# Patient Record
Sex: Male | Born: 2005 | Hispanic: No | Marital: Single | Smoking: Never smoker
Health system: Southern US, Community
[De-identification: ages and names within clinical notes are randomized; demographics above are authoritative.]

## PROBLEM LIST (undated history)

## (undated) ENCOUNTER — Ambulatory Visit (HOSPITAL_COMMUNITY): Source: Home / Self Care

## (undated) DIAGNOSIS — J309 Allergic rhinitis, unspecified: Principal | ICD-10-CM

## (undated) DIAGNOSIS — J302 Other seasonal allergic rhinitis: Secondary | ICD-10-CM

## (undated) HISTORY — PX: HERNIA REPAIR: SHX51

## (undated) HISTORY — DX: Allergic rhinitis, unspecified: J30.9

---
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
## 2023-01-09 DIAGNOSIS — Z202 Contact with and (suspected) exposure to infections with a predominantly sexual mode of transmission: Secondary | ICD-10-CM | POA: Diagnosis not present

## 2023-07-06 IMAGING — US US SCROTUM W/ DOPPLER COMPLETE
1 series · 13 of 25 positions shown · non-contrast
Comparison: None

CLINICAL DATA: Acute onset of RIGHT testicular pain this morning

EXAM:
SCROTAL ULTRASOUND
DOPPLER ULTRASOUND OF THE TESTICLES
TECHNIQUE: Complete ultrasound examination of the testicles, epididymis, and
other scrotal structures was performed. Color and spectral Doppler
ultrasound were also utilized to evaluate blood flow to the
testicles.

[Series 1: us scrotum w/doppler · 47 acquisitions, 13 frames shown]
[im 1/47]
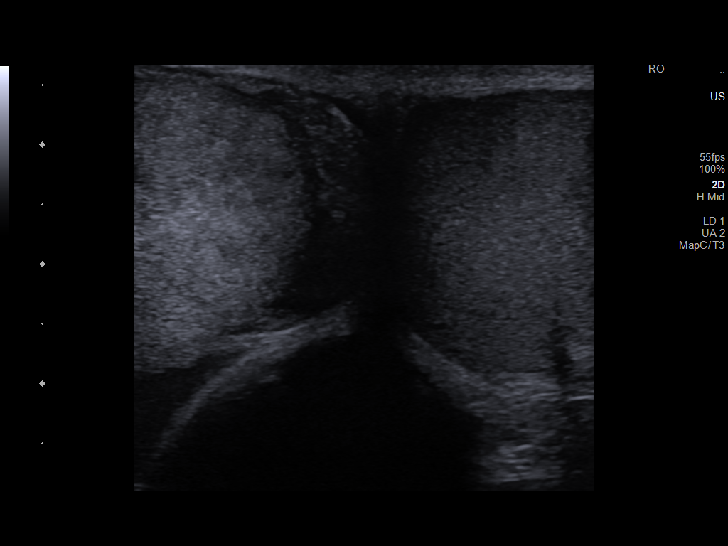
[im 4/47]
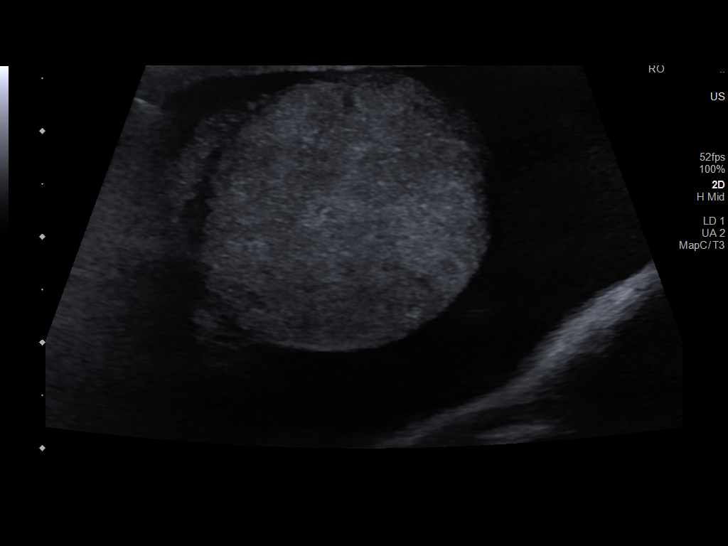
[im 8/47]
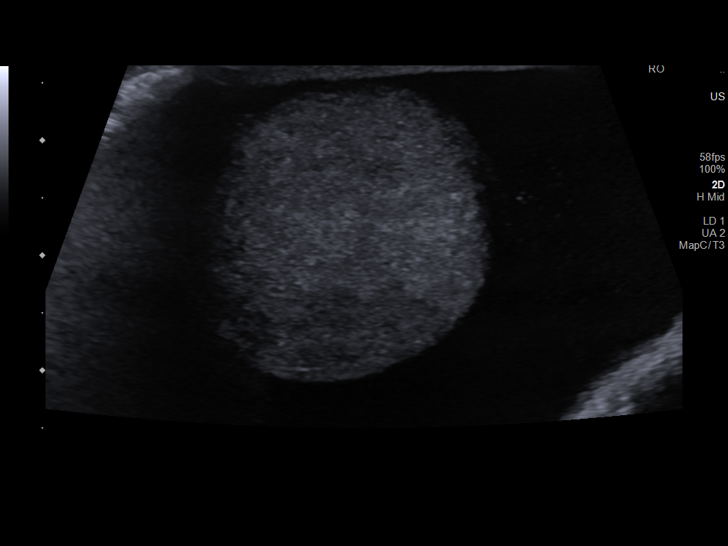
[im 12/47]
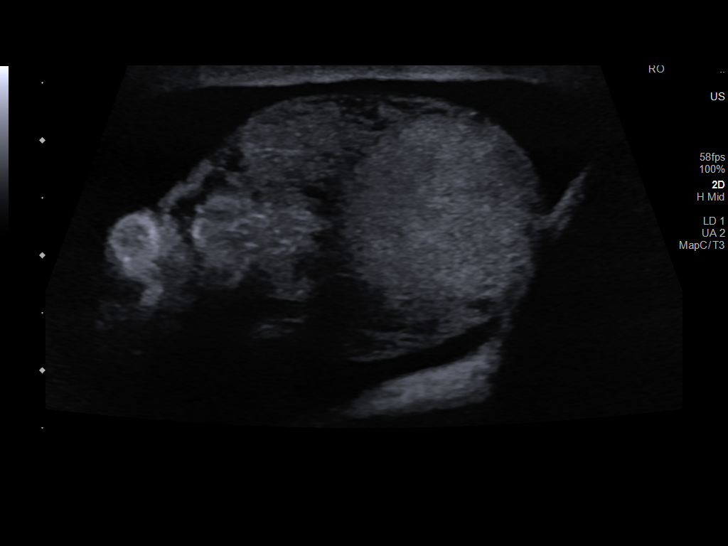
[im 16/47]
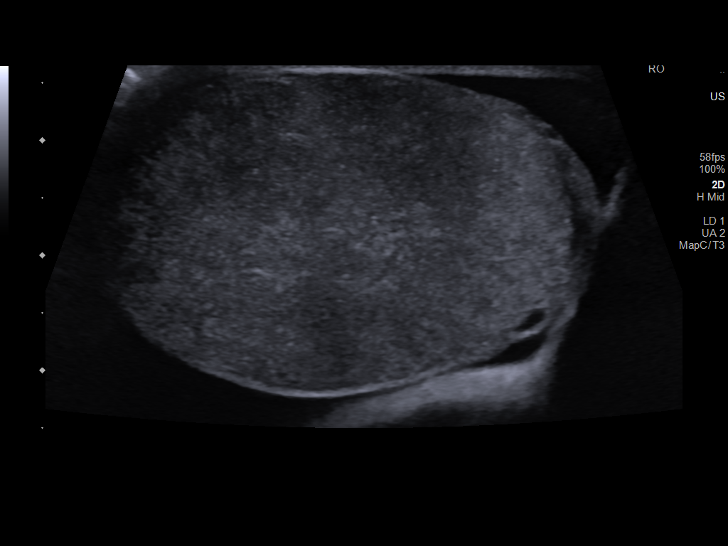
[im 20/47]
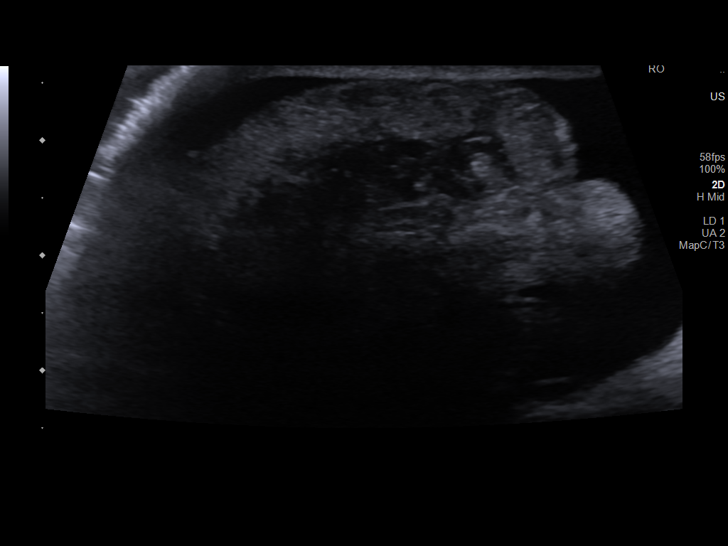
[im 24/47]
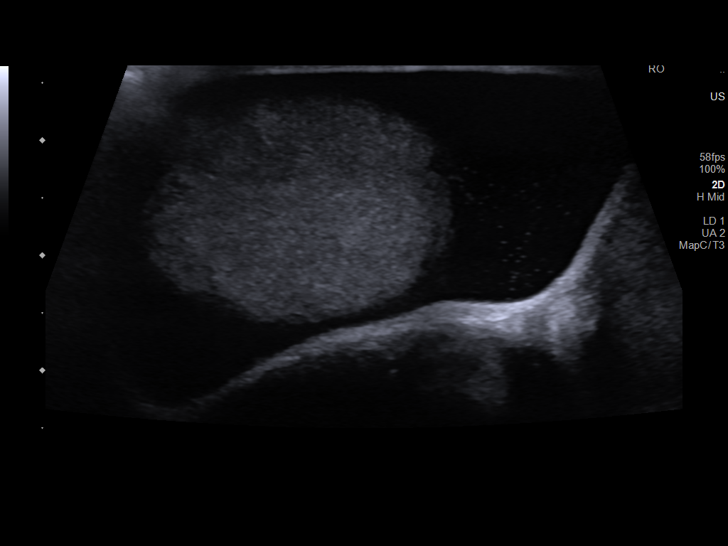
[im 27/47]
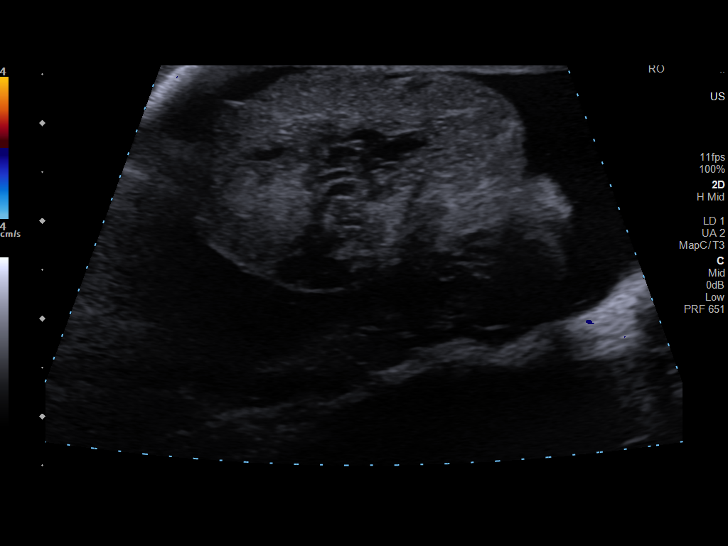
[im 31/47]
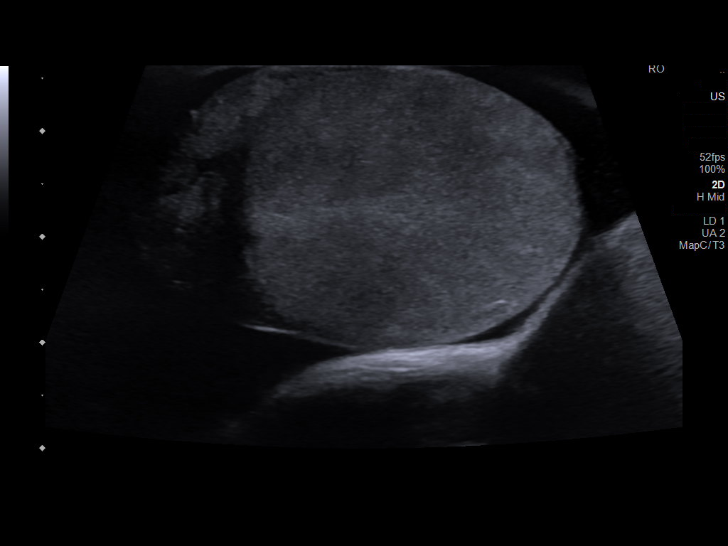
[im 35/47]
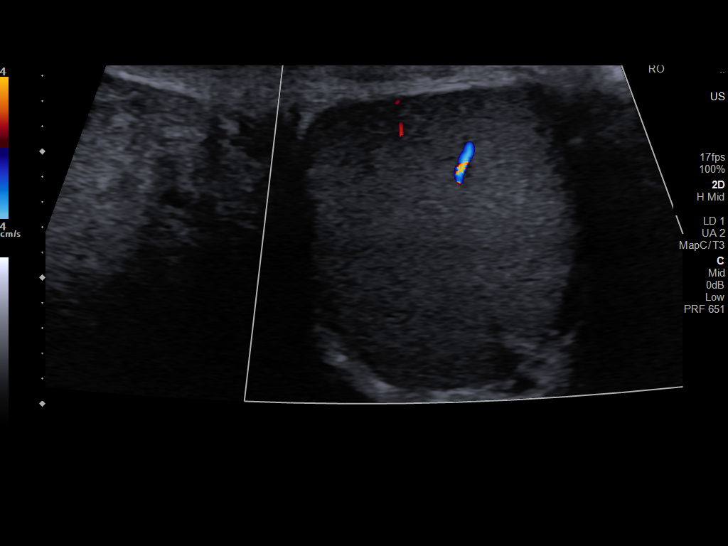
[im 39/47]
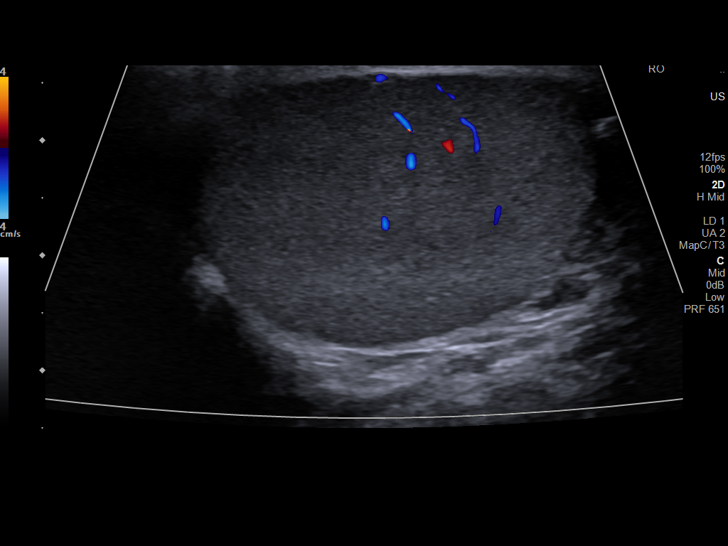
[im 43/47]
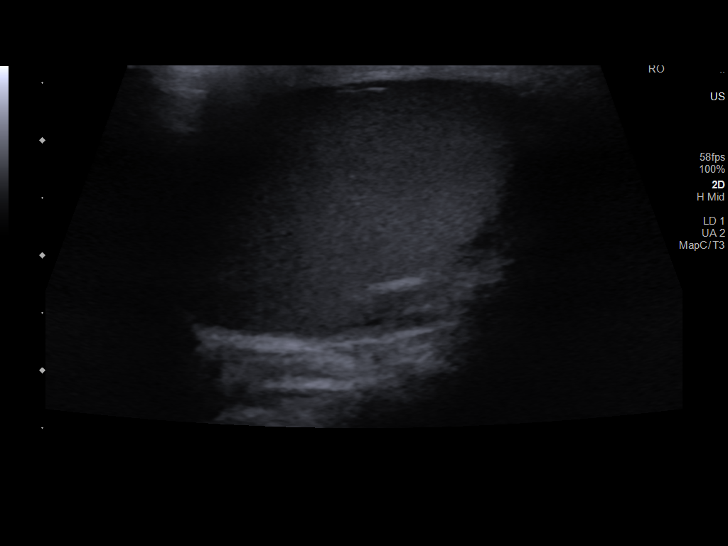
[im 47/47]
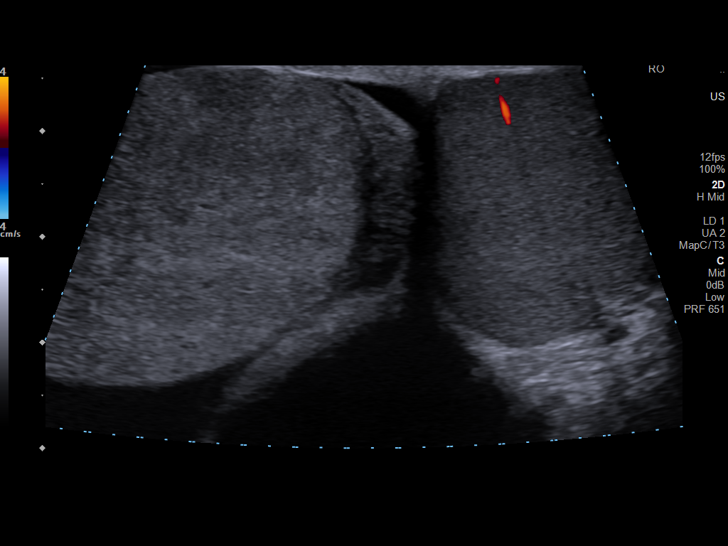

[13 of 25 positions shown; findings below may reference images not displayed]

FINDINGS: Right testicle

Measurements: 4.2 x 2.7 x 2.9 cm. Heterogeneous echogenicity. No
discrete mass or calcification. Absent internal blood flow on color
Doppler imaging.

Left testicle

Measurements: 3.6 x 2.4 x 2.0 cm. Normal echogenicity without mass
or calcification. Internal blood flow present on color Doppler
imaging.

Right epididymis: Enlarged and heterogeneous. Absent internal blood
flow.

Left epididymis:  Normal in size and appearance.

Hydrocele:  Small to moderate RIGHT hydrocele

Varicocele:  None visualized.

Pulsed Doppler interrogation of both testes demonstrates normal low
resistance arterial and venous waveforms within the LEFT testis. No
internal blood flow identified within the RIGHT testis by color
Doppler imaging to attempt pulse Doppler interrogation.
IMPRESSION: Enlarged heterogeneous RIGHT testis and epididymis, both lacking
internal blood flow on color Doppler imaging, consistent with
testicular torsion.

Small to moderate RIGHT hydrocele.

Critical Value/emergent results were called by telephone at the time
of interpretation on 06/21/2021 at 4412 hours to provider REMACHE
MOJPROGRAMER HEQIMI, who verbally acknowledged these results.

## 2023-12-13 DIAGNOSIS — N342 Other urethritis: Secondary | ICD-10-CM | POA: Diagnosis not present

## 2024-05-03 DIAGNOSIS — Z6820 Body mass index (BMI) 20.0-20.9, adult: Secondary | ICD-10-CM | POA: Diagnosis not present

## 2024-05-03 DIAGNOSIS — R030 Elevated blood-pressure reading, without diagnosis of hypertension: Secondary | ICD-10-CM | POA: Diagnosis not present

## 2024-05-03 DIAGNOSIS — Z113 Encounter for screening for infections with a predominantly sexual mode of transmission: Secondary | ICD-10-CM | POA: Diagnosis not present

## 2024-06-05 ENCOUNTER — Encounter (HOSPITAL_COMMUNITY): Payer: Self-pay

## 2024-06-05 ENCOUNTER — Ambulatory Visit (HOSPITAL_COMMUNITY)
Admission: EM | Admit: 2024-06-05 | Discharge: 2024-06-05 | Disposition: A | Discharge status: Home / Self Care | Attending: Emergency Medicine | Admitting: Emergency Medicine

## 2024-06-05 VITALS — BP 134/71 | HR 80 | Temp 99.3°F | Resp 14

## 2024-06-05 DIAGNOSIS — Z113 Encounter for screening for infections with a predominantly sexual mode of transmission: Secondary | ICD-10-CM | POA: Insufficient documentation

## 2024-06-05 DIAGNOSIS — Z202 Contact with and (suspected) exposure to infections with a predominantly sexual mode of transmission: Secondary | ICD-10-CM | POA: Insufficient documentation

## 2024-06-05 LAB — HIV ANTIBODY (ROUTINE TESTING W REFLEX): HIV Screen 4th Generation wRfx: NONREACTIVE

## 2024-06-05 MED ORDER — DOXYCYCLINE HYCLATE 100 MG PO CAPS: 100.0000 mg | ORAL_CAPSULE | Freq: Two times a day (BID) | ORAL | 0 refills | Status: AC

## 2024-06-05 NOTE — Discharge Instructions (Signed)
 Start taking doxycycline  twice daily for 7 days for chlamydia coverage. Your results were return over the next few days and someone will call if results are positive or require any additional treatment. Follow-up with your primary care provider or return here as needed.

## 2024-06-05 NOTE — ED Triage Notes (Signed)
 Patient states his sexual partner told hin tht she had chlamydia. Patient denies any symptoms.

## 2024-06-05 NOTE — ED Provider Notes (Signed)
 [facility]    CSN: [id number] Arrival date & time: 06/05/24  1453      History   Chief Complaint No chief complaint on file.   HPI Philip Hensley is a 18 y.o. male.   Patient presents requesting STD testing.  Patient states that his partner recently told him that they tested positive for chlamydia.  Patient denies any penile discharge, penile pain, dysuria, urinary frequency/urgency, hematuria, penile lesions, abdominal pain, or back pain.  The history is provided by the patient and medical records.    Past Medical History:  Diagnosis Date   Allergic rhinitis 04/30/2013   Seasonal allergies     Patient Active Problem List   Diagnosis Date Noted   Eczema 09/20/2015   Allergic rhinitis 04/30/2013    Past Surgical History:  Procedure Laterality Date   HERNIA REPAIR     SCROTAL EXPLORATION Right 06/21/2021   Procedure: SCROTUM EXPLORATION bilateral orchidpexy right detorsion ;  Surgeon: Sherrilee Belvie CROME, MD;  Location: [facility];  Service: Urology;  Laterality: Right;       Home Medications    Prior to Admission medications   Medication Sig Start Date End Date Taking? Authorizing Provider  doxycycline  (VIBRAMYCIN ) 100 MG capsule Take 1 capsule (100 mg total) by mouth 2 (two) times daily for 7 days. 06/05/24 06/12/24 Yes Johnie Flaming A, NP  acetaminophen  (TYLENOL ) 500 MG tablet Take 1 tablet (500 mg total) by mouth every 6 (six) hours as needed. 09/04/21   Charlyn Sora, MD  ibuprofen  (ADVIL ) 600 MG tablet Take 1 tablet (600 mg total) by mouth every 6 (six) hours as needed for headache or fever. 09/04/21   Charlyn Sora, MD    Family History Family History  Problem Relation Age of Onset   Healthy Mother    Healthy Father     Social History Social History   Tobacco Use   Smoking status: Never    Passive exposure: Yes   Smokeless tobacco: Never  Vaping Use   Vaping status: Never Used  Substance Use Topics   Alcohol use: No   Drug  use: No     Allergies   Bee venom   Review of Systems Review of Systems  Per HPI  Physical Exam Triage Vital Signs ED Triage Vitals [06/05/24 1510]  Encounter Vitals Group     BP 134/71     Girls Systolic BP Percentile      Girls Diastolic BP Percentile      Boys Systolic BP Percentile      Boys Diastolic BP Percentile      Pulse Rate 80     Resp 14     Temp 99.3 F (37.4 C)     Temp Source Oral     SpO2 99 %     Weight      Height      Head Circumference      Peak Flow      Pain Score 0     Pain Loc      Pain Education      Exclude from Growth Chart    No data found.  Updated Vital Signs BP 134/71 (BP Location: Right Arm)   Pulse 80   Temp 99.3 F (37.4 C) (Oral)   Resp 14   SpO2 99%   Visual Acuity Right Eye Distance:   Left Eye Distance:   Bilateral Distance:    Right Eye Near:   Left Eye Near:    Bilateral  Near:     Physical Exam Vitals and nursing note reviewed.  Constitutional:      General: He is awake. He is not in acute distress.    Appearance: Normal appearance. He is well-developed and well-groomed. He is not ill-appearing.  Genitourinary:    Comments: Exam deferred Neurological:     Mental Status: He is alert.  Psychiatric:        Behavior: Behavior is cooperative.      UC Treatments / Results  Labs (all labs ordered are listed, but only abnormal results are displayed) Labs Reviewed  HIV ANTIBODY (ROUTINE TESTING W REFLEX)  RPR  CYTOLOGY, (ORAL, ANAL, URETHRAL) ANCILLARY ONLY    EKG   Radiology No results found.  Procedures Procedures (including critical care time)  Medications Ordered in UC Medications - No data to display  Initial Impression / Assessment and Plan / UC Course  I have reviewed the triage vital signs and the nursing notes.  Pertinent labs & imaging results that were available during my care of the patient were reviewed by me and considered in my medical decision making (see chart for  details).     Patient is overall well-appearing.  Vitals are stable.  GU exam deferred.  Patient perform self swab for STD.  HIV and RPR ordered.  Prescribed doxycycline  for chlamydia prophylaxis due to recent exposure.  Discussed follow-up and return precautions. Final Clinical Impressions(s) / UC Diagnoses   Final diagnoses:  Exposure to chlamydia  Screening for STD (sexually transmitted disease)     Discharge Instructions      Start taking doxycycline  twice daily for 7 days for chlamydia coverage. Your results were return over the next few days and someone will call if results are positive or require any additional treatment. Follow-up with your primary care provider or return here as needed.   ED Prescriptions     Medication Sig Dispense Auth. Provider   doxycycline  (VIBRAMYCIN ) 100 MG capsule Take 1 capsule (100 mg total) by mouth 2 (two) times daily for 7 days. 14 capsule Johnie Flaming A, NP      PDMP not reviewed this encounter.   Johnie Flaming A, NP 06/05/24 1534

## 2024-06-06 LAB — RPR: RPR Ser Ql: NONREACTIVE

## 2024-06-08 LAB — CYTOLOGY, (ORAL, ANAL, URETHRAL) ANCILLARY ONLY
Chlamydia: NEGATIVE
Comment: NEGATIVE
Comment: NEGATIVE
Comment: NORMAL
Neisseria Gonorrhea: NEGATIVE
Trichomonas: NEGATIVE

## 2024-07-10 ENCOUNTER — Encounter: Payer: Self-pay | Admitting: *Deleted
